# Patient Record
Sex: Male | Born: 2000 | Race: Black or African American | Hispanic: No | Marital: Single | Smoking: Never smoker
Health system: Southern US, Community
[De-identification: ages and names within clinical notes are randomized; demographics above are authoritative.]

---
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
## 2022-09-20 ENCOUNTER — Encounter: Payer: Self-pay | Admitting: Pediatrics

## 2023-06-13 ENCOUNTER — Ambulatory Visit (INDEPENDENT_AMBULATORY_CARE_PROVIDER_SITE_OTHER): Admitting: Family Medicine

## 2023-06-13 ENCOUNTER — Encounter: Payer: Self-pay | Admitting: Family Medicine

## 2023-06-13 VITALS — BP 110/80 | HR 64 | Wt 163.8 lb

## 2023-06-13 DIAGNOSIS — M25531 Pain in right wrist: Secondary | ICD-10-CM | POA: Diagnosis not present

## 2023-06-13 NOTE — Progress Notes (Signed)
   Subjective:    Patient ID: Thomas Frederick, male    DOB: [birth year], 23 y.o.   MRN: [id number]  HPI He states that while playing basketball 1 month ago, he was undercut and fell landing on his right wrist.  He is right-handed.  Since then he is still having difficulty with full flexion of his wrist and only minimal discomfort in the radial area. Also of note is the fact that on June 23 he will be going to join the [employer].  Review of Systems     Objective:    Physical Exam Exam of the right wrist shows normal motion of the thumb without pain.  No tenderness over the snuffbox.  Only slight tenderness over the radial head.  Good flexion and extension as well as strength in his hand.       Assessment & Plan:  Wrist pain, acute, right - Plan: DG Wrist Complete Right I explained that I will get an x-ray on this to see if he indeed did fracture this.  I explained that at this point probably the main thing would be doing physical therapy to get his strength and range of motion told he is back to normal as he does note difficulty with flexion of the wrist especially when he tries to play basketball.

## 2023-06-15 ENCOUNTER — Ambulatory Visit
Admission: RE | Admit: 2023-06-15 | Discharge: 2023-06-15 | Disposition: A | Discharge status: Home / Self Care | Source: Ambulatory Visit | Attending: Family Medicine | Admitting: Family Medicine

## 2023-06-15 ENCOUNTER — Ambulatory Visit: Payer: Self-pay | Admitting: Family Medicine

## 2023-06-16 ENCOUNTER — Telehealth: Payer: Self-pay | Admitting: Internal Medicine

## 2023-06-16 NOTE — Telephone Encounter (Signed)
 Left message for pt to call to go over imaging results

## 2023-06-16 NOTE — Telephone Encounter (Signed)
 Please advise  Copied from CRM [id number]. Topic: Clinical - Lab/Test Results >> Jun 16, 2023  3:38 PM Thomas Frederick wrote: Reason for CRM: Patient and his mother calling in to get the results of his recent imaging, they were informed they would get a call once the doctor has went over the scan.

## 2023-06-16 NOTE — Telephone Encounter (Signed)
 Per Dr. Robina Chol-  The x-ray shows a possible fracture of a bone in your wrist.  The bone that they are talking about does not heal well.  The safest thing would be to get have this rechecked again in several weeks and by that time you will probably be in the [employer].  You definitely need to let them know about this.  Follow-up x-rays to definitely make the diagnosis and to make sure that is healing is very important as I mentioned earlier they do not always heal

## 2023-06-19 NOTE — Telephone Encounter (Signed)
 I spoke with patient and he states he has an appointment this Thursday and he has already seen his results. I asked him if he would like someone to notify his mom of the results and HIPAA policy and he was not interested in her knowing his results.

## 2023-06-19 NOTE — Telephone Encounter (Signed)
 noted

## 2023-06-19 NOTE — Telephone Encounter (Signed)
 2d attempt- Left message for pt to call me back again.   We do not have an updated HIPAA Form on patient from [facility],  so I am unable to speak with mom about patient's imaging since patient is over the age of 41. Pt will have to call in to speak with us  first.      Copied from CRM [id number]. Topic: Clinical - Lab/Test Results >> Jun 19, 2023  2:51 PM Everette C wrote: Reason for CRM: The patient's mother would like to be contacted to review the patient's recent imaging with a member of clinical staff. The patient's mother would like to know what next steps are needed to be taken for their right wrist. Please contact further when possible.

## 2023-06-22 ENCOUNTER — Encounter: Payer: Self-pay | Admitting: Family Medicine

## 2023-06-22 ENCOUNTER — Ambulatory Visit: Admitting: Family Medicine

## 2023-06-22 VITALS — BP 116/70 | HR 67 | Wt 163.8 lb

## 2023-06-22 DIAGNOSIS — S62001A Unspecified fracture of navicular [scaphoid] bone of right wrist, initial encounter for closed fracture: Secondary | ICD-10-CM

## 2023-06-22 NOTE — Progress Notes (Signed)
   Subjective:    Patient ID: Thomas Frederick, male    DOB: September 25, 2000, 23 y.o.   MRN: [id number]  HPI For recheck on right wrist pain and possible scaphoid fracture.  He discussed that this with his Landscape architect and everything is on hold until this is settled.  He still is having some difficulty with pain especially with flexion and extension of his wrist.   Review of Systems     Objective:    Physical Exam Alert and in no distress.  The x-ray was reviewed with him.       Assessment & Plan:  Closed nondisplaced fracture of scaphoid of right wrist, unspecified portion of scaphoid, initial encounter - Plan: DG Wrist Complete Right I discussed the issue with a fracture of this particular bone and the possibility of nonhealed union.  I will order an x-ray in a month to reevaluate this.  If there is any evidence of nonhealing I will refer to orthopedics.  He will let his Landscape architect know about this.

## 2023-07-13 ENCOUNTER — Ambulatory Visit
Admission: RE | Admit: 2023-07-13 | Discharge: 2023-07-13 | Disposition: A | Discharge status: Home / Self Care | Source: Ambulatory Visit | Attending: Family Medicine | Admitting: Family Medicine

## 2023-07-13 DIAGNOSIS — S62001A Unspecified fracture of navicular [scaphoid] bone of right wrist, initial encounter for closed fracture: Secondary | ICD-10-CM

## 2023-07-14 ENCOUNTER — Ambulatory Visit: Payer: Self-pay | Admitting: Family Medicine

## 2023-07-14 NOTE — Addendum Note (Signed)
 Addended by: JOYCE NORLEEN BROCKS on: 07/14/2023 08:13 AM   Modules accepted: Orders

## 2023-07-19 ENCOUNTER — Ambulatory Visit (HOSPITAL_BASED_OUTPATIENT_CLINIC_OR_DEPARTMENT_OTHER): Admitting: Student

## 2023-07-21 ENCOUNTER — Ambulatory Visit (HOSPITAL_BASED_OUTPATIENT_CLINIC_OR_DEPARTMENT_OTHER): Admitting: Student

## 2023-07-21 ENCOUNTER — Encounter (HOSPITAL_BASED_OUTPATIENT_CLINIC_OR_DEPARTMENT_OTHER): Payer: Self-pay | Admitting: Student

## 2023-07-21 DIAGNOSIS — S62031G Displaced fracture of proximal third of navicular [scaphoid] bone of right wrist, subsequent encounter for fracture with delayed healing: Secondary | ICD-10-CM | POA: Diagnosis not present

## 2023-07-21 NOTE — Progress Notes (Signed)
 Chief Complaint: Right wrist pain     History of Present Illness:    Thomas Frederick is a 23 y.o. male who presents for evaluation of right wrist pain.  Patient reports sustaining a FOOSH injury while playing basketball back in early May.  He was seen in primary care for x-rays on 6/3 which demonstrated presence of a scaphoid fracture.  Continued conservative management with an over-the-counter wrist brace and had follow-up x-rays performed 1 week ago, which continued to demonstrate the fracture without any significant healing.  He does have some pain in the wrist around the base of the thumb and is still unable to play basketball.  Takes ibuprofen for pain.  Denies any numbness or tingling.  He is pending entry into the [employer] which has been delayed due to his wrist injury.   Surgical History:   None  PMH/PSH/Family History/Social History/Meds/Allergies:   History reviewed. No pertinent past medical history. Past Surgical History:  Procedure Laterality Date   TESTICLE TORSION REDUCTION Left 07/23/2016   Procedure: SCROTAL EXPLORATION;  Surgeon: Ottelin, Mark, MD;  Location[facility];  Service: Urology;  Laterality: Left;   Social History   Socioeconomic History   Marital status: Single    Spouse name: Not on file   Number of children: Not on file   Years of education: Not on file   Highest education level: Not on file  Occupational History   Not on file  Tobacco Use   Smoking status: Never   Smokeless tobacco: Never  Substance and Sexual Activity   Alcohol use: No   Drug use: No   Sexual activity: Never  Other Topics Concern   Not on file  Social History Narrative   Not on file   Social Drivers of Health   Financial Resource Strain: Not on file  Food Insecurity: Not on file  Transportation Needs: Not on file  Physical Activity: Not on file  Stress: Not on file  Social Connections: Not on file   Family History  Problem Relation Age  of Onset   Crohn's disease Maternal Grandmother    Depression Maternal Grandmother    Depression Paternal Grandmother    ADD / ADHD Sister     (BMI (body mass index), pediatric, 85% to less than 95% for age) Brother 57    (Well child check) Brother 54   No Known Allergies Current Outpatient Medications  Medication Sig Dispense Refill   mupirocin  ointment (BACTROBAN ) 2 % Apply 1 application topically 2 (two) times daily. (Patient not taking: Reported on 06/22/2023) 22 g 0   No current facility-administered medications for this visit.   No results found.  Review of Systems:   A ROS was performed including pertinent positives and negatives as documented in the HPI.  Physical Exam :   Constitutional: NAD and appears stated age Neurological: Alert and oriented Psych: Appropriate affect and cooperative There were no vitals taken for this visit.   Comprehensive Musculoskeletal Exam:    Exam of the right wrist demonstrates tenderness palpation over the anatomical snuffbox.  No obvious visible deformity.  Able to form a composite fist with 5/5 grip strength.  Full range of motion with wrist extension and bilateral deviation, slightly limited in flexion.  Radial pulse 2+.  Imaging:   Xray review from 07/13/2023 (right wrist 4  views): Redemonstration of proximal pole scaphoid fracture with increased lucency compared to prior radiographs.   I personally reviewed and interpreted the radiographs.   Assessment:   23 y.o. male with evidence of a right proximal pole scaphoid fracture that occurred due to a FOOSH injury just over 2 months ago.  Repeat x-rays from last week taken by his PCP demonstrated no significant healing, with slightly increased lucency within the fracture.  Patient does continue to experience pain and this is preventing him from beginning his service in the [employer].  Has been wearing an over-the-counter brace but was not casted.  Discussed that I would like to have him  evaluated further by her hand specialist Dr. Arlinda, but in the meantime I will place him in a thumb spica brace and will order a CT scan for further assessment of healing.  Patient is agreeable to this plan.  Plan :    - Obtain CT scan of the right wrist for further evaluation of fracture healing - Placed in thumb spica brace and follow-up with Dr. Erwin for further evaluation and management    I personally saw and evaluated the patient, and participated in the management and treatment plan.  Leonce Reveal, PA-C Orthopedics

## 2023-07-24 ENCOUNTER — Encounter (HOSPITAL_BASED_OUTPATIENT_CLINIC_OR_DEPARTMENT_OTHER): Payer: Self-pay | Admitting: Student

## 2023-07-25 ENCOUNTER — Ambulatory Visit
Admission: RE | Admit: 2023-07-25 | Discharge: 2023-07-25 | Disposition: A | Discharge status: Home / Self Care | Source: Ambulatory Visit | Attending: Student | Admitting: Student

## 2023-07-25 DIAGNOSIS — S62031G Displaced fracture of proximal third of navicular [scaphoid] bone of right wrist, subsequent encounter for fracture with delayed healing: Secondary | ICD-10-CM

## 2023-08-07 ENCOUNTER — Ambulatory Visit (INDEPENDENT_AMBULATORY_CARE_PROVIDER_SITE_OTHER): Admitting: Orthopedic Surgery

## 2023-08-07 DIAGNOSIS — S62031A Displaced fracture of proximal third of navicular [scaphoid] bone of right wrist, initial encounter for closed fracture: Secondary | ICD-10-CM

## 2023-08-07 NOTE — Progress Notes (Signed)
 Thomas Frederick - 23 y.o. male MRN [id number]  Date of birth: 2000-07-08  Office Visit Note: Visit Date: 08/07/2023 PCP: Thomas Norleen BROCKS, MD Referred by: Thomas Thomas CROME, PA*  Subjective: No chief complaint on file.  HPI: Thomas Frederick is a pleasant 23 y.o. male who presents today with his mother for ongoing right wrist pain.  He had an injury approximately 2 months prior while playing basketball, fell onto the outstretched right wrist.  Was not seen initially, however was seen by his primary care physician a few weeks later with ongoing right wrist pain.  At that time, he underwent radiographs which showed concern for potential proximal pole scaphoid fracture.  He was subsequently seen by Thomas Emiliano, PA in the outpatient setting who obtained a CT scan which showed proximal pole fracture of the scaphoid with concern for developing nonunion.  There is also concern for potential avascular necrosis of the proximal pole.  He has been utilizing a removable wrist splint.  Does have ongoing pain at the wrist level and pain with range of motion and loading of the wrist.  He is being seen by myself today for specific hand surgical evaluation.  Pertinent ROS were reviewed with the patient and found to be negative unless otherwise specified above in HPI.   Visit Reason:Right wrist scaphoid fracture Duration of symptoms:2 month-injury playing basketball Hand dominance: right Occupation: [employer]  Diabetic: No Smoking: No Heart/Lung History:none Blood Thinners: none  Prior Testing/EMG:xray and ct Injections (Date):none Treatments:removable brace Prior Surgery:none  Assessment & Plan: Visit Diagnoses:  1. Closed displaced fracture of proximal third of scaphoid bone of right wrist, initial encounter     Plan: Extensive discussion was had with patient today and his mother regarding his right wrist injury.  I reviewed in detail his previous radiographs as well as a CT scan with both the  patient and his mother today and explained the underlying nature of his injury.  We discussed the proximal pole scaphoid fracture as well as the underlying etiology and pathophysiology of the condition.  We discussed the poor blood supply of the bone in this region which can often preclude the ability for the body to heal this fracture nonoperatively.  However, given that he is only 2 months from his injury, we cannot officially call this a nonunion at this time.  I did explain that with proper immobilization and potential bone stimulator, there is a possibility that he is able to heal this fracture nonoperatively.  We also discussed surgical options including open reduction internal fixation with likely bone grafting.  Risks and benefits of the surgery were also discussed in detail today with both the patient and his mother as well as the postoperative timeline.  I did explain that even with conservative measures such as casting and bone stimulator utilization, this would not preclude our ability to fix this fracture in the future should nonunion persist.  I explained that we would likely perform repeat CT scan somewhere in the 6 to 8-week timeframe from today in order to better check for potential interval healing at that time.  After considering options, patient and mother would like to continue with conservative measures for the right wrist.  I did explain the importance of immobilization in this period of time.  Our plan was to place thumb spica cast today, however the patient is going on vacation with his family over the next week and a half and would like to wait until after the vacation is  over in order to prevent issues with the casting while away.  For the time being, we will place him into a removable thumb spica orthosis and will initiate the paperwork in order to obtain a bone stimulator in order to help facilitate healing.  My hope is that he can begin this in the near future.  He will return in  approximate 2 weeks for transition to a thumb spica cast at that time.  I did explain that at any point, should they want to further discuss potential surgical intervention we could have that discussion.  Patient and mother expressed full understanding.  I spent 45 minutes in the care of this patient today including review of previous documentation, imaging obtained, face-to-face time discussing all options regarding treatment and documenting the encounter.   Follow-up: No follow-ups on file.   Meds & Orders: No orders of the defined types were placed in this encounter.  No orders of the defined types were placed in this encounter.    Procedures: No procedures performed      Clinical History: No specialty comments available.  He reports that he has never smoked. He has never used smokeless tobacco. No results for input(s): HGBA1C, LABURIC in the last 8760 hours.  Objective:   Vital Signs: There were no vitals taken for this visit.  Physical Exam  Gen: Well-appearing, in no acute distress; non-toxic CV: Regular Rate. Well-perfused. Warm.  Resp: Breathing unlabored on room air; no wheezing. Psych: Fluid speech in conversation; appropriate affect; normal thought process  Ortho Exam Right wrist: - Notable tenderness over the scaphoid tubercle and anatomic snuffbox - Wrist range of motion flexion/extension 45/35 with notable pain at endpoints - Able perform composite fist - Sensation intact in all distributions median/radial/ulnar - AIN/PIN/interosseous intact, hand is warm well-perfused  Imaging: No results found. Prior x-rays and CT scan of the right wrist were independently reviewed by myself today, there is evidence of proximal pole scaphoid fracture with concern for developing nonunion, possible avascular necrosis  Past Medical/Family/Surgical/Social History: Medications & Allergies reviewed per EMR, new medications updated. Patient Active Problem List   Diagnosis  Date Noted   Well child check 06/29/2015   BMI (body mass index), pediatric, 5% to less than 85% for age 38/25/2016   No past medical history on file. Family History  Problem Relation Age of Onset   Crohn's disease Maternal Grandmother    Depression Maternal Grandmother    Depression Paternal Grandmother    ADD / ADHD Sister     (BMI (body mass index), pediatric, 85% to less than 95% for age) Brother 29    (Well child check) Brother 79   Past Surgical History:  Procedure Laterality Date   TESTICLE TORSION REDUCTION Left 07/23/2016   Procedure: SCROTAL EXPLORATION;  Surgeon: Ottelin, Mark, MD;  Location: [facility] OR;  Service: Urology;  Laterality: Left;   Social History   Occupational History   Not on file  Tobacco Use   Smoking status: Never   Smokeless tobacco: Never  Substance and Sexual Activity   Alcohol use: No   Drug use: No   Sexual activity: Never    Denzell Colasanti Estela) Arlinda, M.D. [facility], Hand Surgery

## 2023-08-21 ENCOUNTER — Ambulatory Visit: Admitting: Orthopedic Surgery

## 2023-08-21 ENCOUNTER — Other Ambulatory Visit: Payer: Self-pay | Admitting: Orthopedic Surgery

## 2023-08-21 ENCOUNTER — Other Ambulatory Visit (INDEPENDENT_AMBULATORY_CARE_PROVIDER_SITE_OTHER): Payer: Self-pay

## 2023-08-21 DIAGNOSIS — S62031A Displaced fracture of proximal third of navicular [scaphoid] bone of right wrist, initial encounter for closed fracture: Secondary | ICD-10-CM

## 2023-08-21 MED ORDER — VITAMIN D (ERGOCALCIFEROL) 1.25 MG (50000 UNIT) PO CAPS: 50000.0000 [IU] | ORAL_CAPSULE | ORAL | 0 refills | Status: AC

## 2023-08-21 NOTE — Progress Notes (Signed)
 Thomas Frederick - 23 y.o. male MRN [id number]  Date of birth: 12-Nov-2000  Office Visit Note: Visit Date: 08/21/2023 PCP: Joyce Norleen BROCKS, MD Referred by: Joyce Norleen BROCKS, MD  Subjective: No chief complaint on file.  HPI: Thomas Frederick is a pleasant 23 y.o. 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  We initially plan for a thumb spica cast, however they had upcoming travel plans and elected for a removable thumb spica brace at that time.  He is here today for follow-up and likely a thumb spica cast placement.  He has received his bone stimulator and has been utilizing that daily for the past 10 days.  He is being seen by myself today for specific hand surgical evaluation.  Pertinent ROS were reviewed with the patient and found to be negative unless otherwise specified above in HPI.    Assessment & Plan: Visit Diagnoses:  1. Closed displaced fracture of proximal third of scaphoid bone of right wrist, initial encounter     Plan: Extensive discussion was once again had with patient today and his mother regarding his right wrist scaphoid nonunion.  We had reviewed his previous radiographs as well as a CT scan and explained the underlying nature of his injury.  We have discussed the proximal pole scaphoid fracture as well as the underlying etiology and pathophysiology of the condition.  We discussed the poor blood supply of the bone in this region which can often preclude the ability for the body to heal this fracture nonoperatively.  However, given that he is only 2 months from his injury, we cannot officially call this a nonunion at this time.  I did explain that with proper immobilization and potential bone stimulator, there is a possibility that he is able to heal this fracture nonoperatively.    I am pleased to see that he has received his bone stimulator and is utilizing this.  Thumb spica cast will be applied today as  planned.  I have also added vitamin D  supplementation for him as well.  I did explain that at any point, should they want to further discuss potential surgical intervention we could have that discussion.  For now he will follow-up in approximately 1 month for repeat clinical and radiographic check.   Follow-up: No follow-ups on file.   Meds & Orders: No orders of the defined types were placed in this encounter.   Orders Placed This Encounter  Procedures   XR Wrist Complete Right     Procedures: No procedures performed      Clinical History: No specialty comments available.  He reports that he has never smoked. He has never used smokeless tobacco. No results for input(s): HGBA1C, LABURIC in the last 8760 hours.  Objective:   Vital Signs: There were no vitals taken for this visit.  Physical Exam  Gen: Well-appearing, in no acute distress; non-toxic CV: Regular Rate. Well-perfused. Warm.  Resp: Breathing unlabored on room air; no wheezing. Psych: Fluid speech in conversation; appropriate affect; normal thought process  Ortho Exam Right wrist: - Notable tenderness over the scaphoid tubercle and anatomic snuffbox - Wrist range of motion flexion/extension 45/35 with notable pain at endpoints - Able perform composite fist - Sensation intact in all distributions median/radial/ulnar - AIN/PIN/interosseous intact, hand is warm well-perfused  Imaging: XR Wrist Complete Right Result Date: 08/21/2023 Redemonstrated fracture of the proximal pole of the scaphoid     Past Medical/Family/Surgical/Social History: Medications &  Allergies reviewed per EMR, new medications updated. Patient Active Problem List   Diagnosis Date Noted   Well child check 06/29/2015   BMI (body mass index), pediatric, 5% to less than 85% for age 70/25/2016   No past medical history on file. Family History  Problem Relation Age of Onset   Crohn's disease Maternal Grandmother    Depression Maternal  Grandmother    Depression Paternal Grandmother    ADD / ADHD Sister     (BMI (body mass index), pediatric, 85% to less than 95% for age) Brother 3    (Well child check) Brother 57   Past Surgical History:  Procedure Laterality Date   TESTICLE TORSION REDUCTION Left 07/23/2016   Procedure: SCROTAL EXPLORATION;  Surgeon: Ottelin, Mark, MD;  Location: [facility] OR;  Service: Urology;  Laterality: Left;   Social History   Occupational History   Not on file  Tobacco Use   Smoking status: Never   Smokeless tobacco: Never  Substance and Sexual Activity   Alcohol use: No   Drug use: No   Sexual activity: Never    Thomas Frederick Estela) Arlinda, M.D. [facility], Hand Surgery

## 2023-09-18 ENCOUNTER — Other Ambulatory Visit: Payer: Self-pay

## 2023-09-18 ENCOUNTER — Ambulatory Visit (INDEPENDENT_AMBULATORY_CARE_PROVIDER_SITE_OTHER): Admitting: Orthopedic Surgery

## 2023-09-18 DIAGNOSIS — S62031A Displaced fracture of proximal third of navicular [scaphoid] bone of right wrist, initial encounter for closed fracture: Secondary | ICD-10-CM | POA: Diagnosis not present

## 2023-09-18 NOTE — Progress Notes (Signed)
 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 He states he is doing well. Using bone stim everyday. Tingling at times. Some pain but very little (3-4/10). Not taking anything for discomfort. Takes Vitamin D  once a week. Cast is intact.  Cast removed today, does demonstrate ongoing healing from a clinical standpoint, pain is relatively minimal at the scaphoid region with deep palpation.  X-rays obtained today show no significant interval displacement, there is some evidence of bony consolidation.  Will continue with short arm casting for additional 1 month, continue with bone stimulator daily as well.  Follow-up at that time for repeat clinical and radiographic check.  I did explain that the possibility for open reduction internal fixation with bone grafting may still be required in the future.  He expressed full understanding.  Aleric Froelich, MD Hand Surgery, [place]

## 2023-10-18 ENCOUNTER — Other Ambulatory Visit: Payer: Self-pay

## 2023-10-18 ENCOUNTER — Ambulatory Visit (INDEPENDENT_AMBULATORY_CARE_PROVIDER_SITE_OTHER): Admitting: Orthopedic Surgery

## 2023-10-18 DIAGNOSIS — S62031G Displaced fracture of proximal third of navicular [scaphoid] bone of right wrist, subsequent encounter for fracture with delayed healing: Secondary | ICD-10-CM | POA: Diagnosis not present

## 2023-10-18 NOTE — Progress Notes (Signed)
 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 He states he is doing well.  Continues to using bone stim everyday.  Has been compliant with casting.  Cast removed today, does demonstrate ongoing healing from a clinical standpoint, pain is relatively minimal at the scaphoid region with deep palpation.  X-rays obtained today show no significant interval displacement, there continues to show some evidence of bony consolidation.  Will transition to a removable thumb spica Exos cast at this point, continue with bone stimulator daily as well.  Follow-up in 1 month for repeat clinical and radiographic check.  I did explain that the possibility for open reduction internal fixation with bone grafting may still be required in the future.  He expressed full understanding.  Isauro Skelley, MD Hand Surgery, [place]

## 2023-11-13 ENCOUNTER — Encounter: Payer: Self-pay | Admitting: Radiology

## 2023-11-18 NOTE — Progress Notes (Unsigned)
 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  He is now approximately 4 months status post initial injury, he states he is doing well.  Continues to using bone stim everyday.  Has been compliant with thumb spica Exos cast.  Examination today shows minimal pain at the scaphoid region with deep palpation, slight pain elicited with extremes of wrist flexion. X-rays obtained today show no significant interval displacement, there continues to show some evidence of bony consolidation.  I did explain that the possibility for open reduction internal fixation with bone grafting may still be required in the future.  We will proceed with CT scan of the right wrist in order to better delineate ongoing healing of the fracture, patient expressed full understanding.  Krishauna Schatzman, MD Hand Surgery, [place]

## 2023-11-20 ENCOUNTER — Ambulatory Visit (INDEPENDENT_AMBULATORY_CARE_PROVIDER_SITE_OTHER): Admitting: Orthopedic Surgery

## 2023-11-20 ENCOUNTER — Other Ambulatory Visit: Payer: Self-pay

## 2023-11-20 DIAGNOSIS — S62031G Displaced fracture of proximal third of navicular [scaphoid] bone of right wrist, subsequent encounter for fracture with delayed healing: Secondary | ICD-10-CM | POA: Diagnosis not present

## 2023-11-23 ENCOUNTER — Ambulatory Visit
Admission: RE | Admit: 2023-11-23 | Discharge: 2023-11-23 | Disposition: A | Discharge status: Home / Self Care | Source: Ambulatory Visit | Attending: Orthopedic Surgery | Admitting: Orthopedic Surgery

## 2023-11-23 DIAGNOSIS — S62031G Displaced fracture of proximal third of navicular [scaphoid] bone of right wrist, subsequent encounter for fracture with delayed healing: Secondary | ICD-10-CM

## 2023-12-26 ENCOUNTER — Ambulatory Visit: Admitting: Orthopedic Surgery

## 2023-12-26 DIAGNOSIS — S62031A Displaced fracture of proximal third of navicular [scaphoid] bone of right wrist, initial encounter for closed fracture: Secondary | ICD-10-CM

## 2023-12-26 NOTE — Addendum Note (Signed)
 Addended by: Tyesha Joffe G on: 12/26/2023 08:49 AM   Modules accepted: Orders

## 2023-12-26 NOTE — Progress Notes (Signed)
 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  He is now approximately 5 months status post initial injury, he states he is doing well.  CT scan demonstrates appropriate bony consolidation with near complete osseous bridging of the scaphoid fracture.  Examination today shows no pain at the scaphoid region with deep palpation, no pain elicited with extremes of wrist flexion.  At this juncture, we can move away from immobilization and begin range of motion exercises.  Referral will be placed today to hand therapy for wrist range of motion and progression to strengthening as tolerated.  Heylee Tant, MD Hand Surgery, [place]

## 2024-01-01 ENCOUNTER — Ambulatory Visit: Admitting: Orthopedic Surgery

## 2024-01-18 NOTE — Therapy (Signed)
 " OUTPATIENT OCCUPATIONAL THERAPY ORTHO EVALUATION  Patient Name: Thomas Frederick MRN: [id number] DOB:2000-04-11, 24 y.o., male Today's Date: 01/18/2024  PCP: Joyce ALF MD REFERRING PROVIDER: Dr Gildardo Alderton   END OF SESSION:   No past medical history on file. Past Surgical History:  Procedure Laterality Date   TESTICLE TORSION REDUCTION Left 07/23/2016   Procedure: SCROTAL EXPLORATION;  Surgeon: Ottelin, Mark, MD;  Location: [facility] OR;  Service: Urology;  Laterality: Left;   Patient Active Problem List   Diagnosis Date Noted   Well child check 06/29/2015   BMI (body mass index), pediatric, 5% to less than 85% for age 73/25/2016    ONSET DATE: DOI ~6 months ago  REFERRING DIAG: S62.031A (ICD-10-CM) - Closed displaced fracture of proximal third of scaphoid bone of right wrist, initial encounter   THERAPY DIAG:  No diagnosis found.  Rationale for Evaluation and Treatment: Rehabilitation  SUBJECTIVE:   SUBJECTIVE STATEMENT: Now ~6 months s/p Rt scaphoid fx.  Per MD safe to start mobilizing ~2 weeks ago.  He states ***.   Pt accompanied by: {accompnied:27141}  PERTINENT HISTORY: ***  PRECAUTIONS: {Therapy precautions:24002}  RED FLAGS: {PT Red Flags:29287}   WEIGHT BEARING RESTRICTIONS: Yes recommended less than 5lbs for next month in Rt hand/wrist  PAIN:  Are you having pain? Yes: NPRS scale: *** Pain location: *** Pain description: *** Aggravating factors: *** Relieving factors: ***  FALLS: Has patient fallen in last 6 months? {fallsyesno:27318}  PLOF: {PLOF:24004}  PATIENT GOALS: ***  NEXT MD VISIT: PRN   OBJECTIVE: (All objective assessments below are from initial evaluation on: 01/22/24 unless otherwise specified.)   HAND DOMINANCE: Right ***  ADLs: Overall ADLs: States decreased ability to grab, hold household objects, pain and difficulty to open containers, perform FMS tasks (manipulate fasteners on clothing), mild to moderate bathing problems as well.  ***   FUNCTIONAL OUTCOME MEASURES: Eval: Patient Specific Functional Scale: *** (***, ***, ***)  (Higher Score  =  Better Ability for the Selected Tasks)     UPPER EXTREMITY ROM     Shoulder to Wrist AROM Right eval Left eval  Forearm supination    Forearm pronation     Wrist flexion    Wrist extension    Wrist ulnar deviation    Wrist radial deviation    Functional dart thrower's motion (F-DTM) in ulnar flexion    F-DTM in radial extension     (Blank rows = not tested)   Hand AROM Right eval Left eval  Full Fist Ability (or Gap to Distal Palmar Crease)    Thumb Opposition  (Kapandji Scale)     Thumb MCP (0-60)    Thumb IP (0-80)    Thumb Radial Abduction Span     Thumb Palmar Abduction Span     Index MCP (0-90)     Index PIP (0-100)     Index DIP (0-70)      Long MCP (0-90)      Long PIP (0-100)      Long DIP (0-70)      Ring MCP (0-90)      Ring PIP (0-100)      Ring DIP (0-70)      Little MCP (0-90)      Little PIP (0-100)      Little DIP (0-70)      (Blank rows = not tested)   UPPER EXTREMITY MMT:    Eval: *** NT at eval due to recent and still healing injuries. Will be tested  when appropriate.   MMT Right TBD Left TBD  Shoulder flexion    Shoulder abduction    Shoulder adduction    Shoulder extension    Shoulder internal rotation    Shoulder external rotation    Middle trapezius    Lower trapezius    Elbow flexion    Elbow extension    Forearm supination    Forearm pronation    Wrist flexion    Wrist extension    Wrist ulnar deviation    Wrist radial deviation    (Blank rows = not tested)  HAND FUNCTION: Eval: Observed weakness in affected *** hand.  Grip strength Right: *** lbs, Left: *** lbs   COORDINATION: Eval: Observed coordination impairments with affected *** hand. Box and Blocks Test: *** Blocks today (*** is [facility]); 9 Hole Peg Test Right: ***sec, Left: *** sec (*** sec is WFL)   SENSATION: Eval:  Light touch intact today,    *** though diminished around sx area    EDEMA:   Eval: *** Mildly swollen in *** hand and wrist today, ***cm circumferentially around ***  COGNITION: Eval: Overall cognitive status: WFL for evaluation today ***  OBSERVATIONS:   Eval: ***   TODAY'S TREATMENT:  Post-evaluation treatment: ***     PATIENT EDUCATION: Education details: See tx section above for details  Person educated: Patient Education method: Engineer, Structural, Teach back, Handouts  Education comprehension: States and demonstrates understanding, Additional Education required    HOME EXERCISE PROGRAM: See tx section above for details    GOALS: Goals reviewed with patient? Yes   SHORT TERM GOALS: (STG required if POC>30 days) Target Date: ***  Pt will obtain protective, custom orthotic. Goal status: TBD/PRN,  MET ***  2.  Pt will demo/state understanding of initial HEP to improve pain levels and prerequisite motion. Goal status: INITIAL   LONG TERM GOALS: Target Date: ***  Pt will improve functional ability by decreased impairment per PSFS assessment from *** to *** or better, for better quality of life. Goal status: INITIAL  2.  Pt will improve grip strength in *** hand from ***lbs to at least ***lbs for functional use at home and in IADLs. Goal status: INITIAL  3.  Pt will improve A/ROM in *** from *** to at least ***, to have functional motion for tasks like reach and grasp.  Goal status: INITIAL  4.  Pt will improve strength in *** from *** MMT to at least *** MMT to have increased functional ability to carry out selfcare and higher-level homecare tasks with less difficulty. Goal status: INITIAL  5.  Pt will improve coordination skills in ***, as seen by within functional limit score on *** testing to have increased functional ability to carry out fine motor tasks (fasteners, etc.) and more complex, coordinated IADLs (meal prep, sports, etc.).  Goal status: INITIAL  6.  Pt will decrease  pain at worst from ***/10 to ***/10 or better to have better sleep and occupational participation in daily roles. Goal status: INITIAL   ASSESSMENT:  CLINICAL IMPRESSION: Patient is a 24 y.o. male who was seen today for occupational therapy evaluation for ***.  The patient will benefit from outpatient occupational therapy to decrease symptoms, improve functional upper extremity use, and increase quality of life.  PERFORMANCE DEFICITS: in functional skills including {OT physical skills:25468}, cognitive skills including problem solving and safety awareness, and psychosocial skills including coping strategies, environmental adaptation, habits, and routines and behaviors.   IMPAIRMENTS: are limiting patient  from ADLs, IADLs, rest and sleep, and leisure.   COMORBIDITIES: {Comorbidities:25485} that affects occupational performance. Patient will benefit from skilled OT to address above impairments and improve overall function.  MODIFICATION OR ASSISTANCE TO COMPLETE EVALUATION: {OT modification:25474}  OT OCCUPATIONAL PROFILE AND HISTORY: {OT PROFILE AND HISTORY:25484}  CLINICAL DECISION MAKING: {OT CDM:25475}  REHAB POTENTIAL: {rehabpotential:25112}  EVALUATION COMPLEXITY: {Evaluation complexity:25115}      PLAN:  OT FREQUENCY: 1-2x/week  OT DURATION: *** weeks through *** and up to *** total visits as needed   PLANNED INTERVENTIONS: 97535 self care/ADL training, 02889 therapeutic exercise, 97530 therapeutic activity, 97112 neuromuscular re-education, 97140 manual therapy, 97035 ultrasound, [id number] electrical stimulation (manual), [id number] Orthotic Initial, [id number] Orthotic/Prosthetic subsequent, compression bandaging, Dry needling, energy conservation, coping strategies training, and patient/family education  RECOMMENDED OTHER SERVICES: none now  ***  CONSULTED AND AGREED WITH PLAN OF CARE: Patient  PLAN FOR NEXT SESSION:   Review initial HEP and recommendations ***   Melvenia Ada, OTR/L, CHT  01/18/2024, 2:49 PM   "

## 2024-01-22 ENCOUNTER — Encounter: Payer: Self-pay | Admitting: Rehabilitative and Restorative Service Providers"

## 2024-01-22 ENCOUNTER — Ambulatory Visit: Admitting: Rehabilitative and Restorative Service Providers"

## 2024-01-22 DIAGNOSIS — R278 Other lack of coordination: Secondary | ICD-10-CM

## 2024-01-22 DIAGNOSIS — M25631 Stiffness of right wrist, not elsewhere classified: Secondary | ICD-10-CM

## 2024-01-22 DIAGNOSIS — M6281 Muscle weakness (generalized): Secondary | ICD-10-CM | POA: Diagnosis not present

## 2024-02-01 NOTE — Therapy (Incomplete)
 " OUTPATIENT OCCUPATIONAL THERAPY TREATMENT NOTE  Patient Name: Thomas Frederick MRN: [id number] DOB:16-Jul-2000, 24 y.o., male Today's Date: 02/01/2024  PCP: [facility] MD REFERRING PROVIDER: Dr Gildardo Alderton   END OF SESSION:    No past medical history on file. Past Surgical History:  Procedure Laterality Date   TESTICLE TORSION REDUCTION Left 07/23/2016   Procedure: SCROTAL EXPLORATION;  Surgeon: Ottelin, Mark, MD;  Location: [facility] OR;  Service: Urology;  Laterality: Left;   Patient Active Problem List   Diagnosis Date Noted   Well child check 06/29/2015   BMI (body mass index), pediatric, 5% to less than 85% for age 26/25/2016    ONSET DATE: DOI ~6 months ago  REFERRING DIAG: S62.031A (ICD-10-CM) - Closed displaced fracture of proximal third of scaphoid bone of right wrist, initial encounter   THERAPY DIAG:  No diagnosis found.  Rationale for Evaluation and Treatment: Rehabilitation  PERTINENT HISTORY: None other related to this injury he had a fall onto his right dominant arm.  Per MD safe to start mobilizing ~2 weeks ago.  He states he was in cast for 1-2 months, then Exos brace which was just removed ~2 weeks ago.  He states some pain at times, after use for a long time or lifting things.   He has a job at [employer].    PRECAUTIONS: None  RED FLAGS: None   WEIGHT BEARING RESTRICTIONS: Yes recommended less than 5lbs for next month in Rt hand/wrist   SUBJECTIVE:   SUBJECTIVE STATEMENT: Now ~6.5 months s/p Rt scaphoid fx.  He arrives after ~2-3 weeks self-management, states ***.     PAIN:  Are you having pain? Yes: NPRS scale: *** 0/10 now at rest and at worst  in the past week up to 5-6/10  Pain location: Rt wrist/arm Pain description: Sore and aching at times Aggravating factors: Lifting weight or repetitive motion Relieving factors: Rest   PATIENT GOALS: Safely improve function to return to all normal activities  NEXT MD VISIT: PRN    OBJECTIVE: (All  objective assessments below are from initial evaluation on: 01/22/24 unless otherwise specified.)   HAND DOMINANCE: Right   ADLs: Overall ADLs: States decreased ability to to do heavier tasks like grab, hold household objects, pain and difficulty to open containers   FUNCTIONAL OUTCOME MEASURES: Eval: Patient Specific Functional Scale: 6 (basketball, push-ups, working tasks)  (Higher Score  =  Better Ability for the Selected Tasks)     UPPER EXTREMITY ROM     Shoulder to Wrist AROM Right eval Rt 02/05/24  Forearm supination 60 ***  Forearm pronation  65 ***  Wrist flexion 35 ***  Wrist extension 46 ***  Wrist ulnar deviation 23 ***  Wrist radial deviation 7 ***  (Blank rows = not tested)   Hand AROM Right eval  Full Fist Ability (or Gap to Distal Palmar Crease) Full fist   Thumb Opposition  (Kapandji Scale)  8/10  (Blank rows = not tested)   UPPER EXTREMITY MMT:     MMT Right 01/22/24 Rt 02/05/24  Middle trapezius    Lower trapezius    Elbow flexion 4+/5 ***/5  Elbow extension 5/5 ***/5  Forearm supination 4/5 ***/5  Forearm pronation 4/5 ***/5  Wrist flexion 4+/5 ***/5  Wrist extension 4+/5 ***/5  Wrist ulnar deviation    Wrist radial deviation    (Blank rows = not tested)  HAND FUNCTION: 02/05/24:  Grip Rt: ***#    Eval: Observed weakness in affected Rt hand.  Grip  strength Right: 43 lbs, Left: 90 lbs   OBSERVATIONS:   Eval: Not very tender, range of motion and strength are limited.  He seems to be tolerating cold turkey weaning from his brace over the past 2 weeks.  He is attempting to do some higher level activities like push-ups and sports, which he was recommended to wait several weeks until his arm is more flexible and stronger.   TODAY'S TREATMENT:  02/05/24: *** Review initial HEP and recommendations, follow-up in approximately 2 to 3 weeks.  He may be ready for discharge whenever he understands plan of care, and his motion and strength are within  normal limits.   Post-evaluation treatment:   Patient was recommended to do no sports or heavy lifting pushing or pulling for the next month.  He should focus on his mobility first, and also light strengthening until it is easy.  He was also given a compressive wrist strap to wear during work and other tasks to help him wean from his brace.  He was given the following home exercise program to include dynamic motion, stretches, light strengthening for the forearm and wrist.  He tolerated these well with no sharp pains, leaves stating understanding all directions   Exercises - Standing Elbow Flexion Extension AROM  - 4-6 x daily - 10-15 reps - Forearm Supination Stretch  - 3-4 x daily - 3-5 reps - 15 sec hold - Forearm Pronation Stretch  - 3-4 x daily - 3-5 reps - 15 sec hold - Bend and Pull Back Wrist SLOWLY  - 4 x daily - 10-15 reps - Wrist Flexion Stretch  - 4 x daily - 3-5 reps - 15 sec hold - Wrist Prayer Stretch  - 4 x daily - 3-5 reps - 15 sec hold - Seated Wrist Ulnar Deviation Stretch  - 3-5 x daily - 3-5 reps - 15 sec hold - Seated Wrist Radial Deviation Stretch  - 3-5 x daily - 3-5 reps - 15 hold - Stretch thumb toward base of small finger (put hand in LAP)  - 2-3 x daily - 3-5 reps - 15 sec hold - Towel Roll Grip with Forearm in Neutral  - 3 x daily - 5 reps - 10 sec hold - Hammer Stretch or Strength   - 2-4 x daily - 1-2 sets - 10-15 reps - Wrist Extension with Resistance  - 2-4 x daily - 1-2 sets - 10-15 reps - Wrist Flexion with Resistance  - 2-4 x daily - 1-2 sets - 10-15 reps   PATIENT EDUCATION:` Education details: See tx section above for details  Person educated: Patient Education method: Verbal Instruction, Teach back, Handouts  Education comprehension: States and demonstrates understanding, Additional Education required    HOME EXERCISE PROGRAM: Access Code: [email or web address] URL: [email or web address] Date: 01/22/2024 Prepared by: Melvenia Ada   GOALS: Goals reviewed with patient? Yes   SHORT TERM GOALS: (STG required if POC>30 days) Target Date: 01/22/2024  Pt will demo/state understanding of initial HEP to improve pain levels and prerequisite motion. Goal status: Goal met   LONG TERM GOALS: Target Date: 03/08/2024  Pt will improve functional ability by decreased impairment per PSFS assessment from 6 to 8 or better, for better quality of life. Goal status: INITIAL  2.  Pt will improve grip strength in Rt hand from 43lbs to at least 60lbs for functional use at home and in IADLs. Goal status: INITIAL  3.  Pt will improve A/ROM in Rt wrist flex/ext from 35/46 to  at least 60* each, to have functional motion for tasks like reach and grasp.  Goal status: INITIAL  4.  Pt will improve strength in Rt forearm pro/supination from 4/5 MMT to at least 5/5 MMT to have increased functional ability to carry out selfcare and higher-level homecare tasks with less difficulty. Goal status: INITIAL  5.  Pt will decrease pain at worst from 5-6/10 to 1-2/10 or better to have better sleep and occupational participation in daily roles. Goal status: INITIAL   ASSESSMENT:  CLINICAL IMPRESSION: 02/05/24: ***  Eval: Patient is a 24 y.o. male who was seen today for occupational therapy evaluation for stiffness, weakness, decreased functional ability in the right dominant hand and arm after a fall and conservative healing of scaphoid fracture.  The patient will benefit from outpatient occupational therapy to decrease symptoms, improve functional upper extremity use, and increase quality of life.     PLAN:  OT FREQUENCY: Up to 1 x/week  OT DURATION: Up to 6 weeks through 03/08/2024 and up to 6 total visits as needed   PLANNED INTERVENTIONS: 97535 self care/ADL training, 02889 therapeutic exercise, 97530 therapeutic activity, 97112 neuromuscular re-education, 97140 manual therapy, 97035 ultrasound, 97032 electrical stimulation (manual),  97760 Orthotic Initial, [id number] Orthotic/Prosthetic subsequent, compression bandaging, Dry needling, energy conservation, coping strategies training, and patient/family education  CONSULTED AND AGREED WITH PLAN OF CARE: Patient  PLAN FOR NEXT SESSION:   ***   Melvenia Ada, OTR/L, CHT  02/01/2024, 9:14 AM   "

## 2024-02-05 ENCOUNTER — Encounter: Admitting: Rehabilitative and Restorative Service Providers"

## 2024-02-28 ENCOUNTER — Ambulatory Visit: Admitting: Orthopedic Surgery
